# Patient Record
Sex: Male | Born: 1980 | Race: Black or African American | Hispanic: No | Marital: Single | Smoking: Never smoker
Health system: Southern US, Community
[De-identification: ages and names within clinical notes are randomized; demographics above are authoritative.]

## PROBLEM LIST (undated history)

## (undated) DIAGNOSIS — E119 Type 2 diabetes mellitus without complications: Secondary | ICD-10-CM

## (undated) DIAGNOSIS — I10 Essential (primary) hypertension: Secondary | ICD-10-CM

## (undated) HISTORY — DX: Essential (primary) hypertension: I10

## (undated) HISTORY — DX: Type 2 diabetes mellitus without complications: E11.9

---

## 2020-03-31 ENCOUNTER — Emergency Department: Payer: Self-pay

## 2020-03-31 ENCOUNTER — Other Ambulatory Visit: Payer: Self-pay

## 2020-03-31 ENCOUNTER — Emergency Department
Admission: EM | Admit: 2020-03-31 | Discharge: 2020-03-31 | Disposition: A | Discharge status: Home / Self Care | Payer: Self-pay | Attending: Emergency Medicine | Admitting: Emergency Medicine

## 2020-03-31 DIAGNOSIS — I10 Essential (primary) hypertension: Secondary | ICD-10-CM | POA: Insufficient documentation

## 2020-03-31 DIAGNOSIS — R112 Nausea with vomiting, unspecified: Secondary | ICD-10-CM | POA: Insufficient documentation

## 2020-03-31 DIAGNOSIS — R5381 Other malaise: Secondary | ICD-10-CM | POA: Insufficient documentation

## 2020-03-31 DIAGNOSIS — R1031 Right lower quadrant pain: Secondary | ICD-10-CM | POA: Insufficient documentation

## 2020-03-31 DIAGNOSIS — E119 Type 2 diabetes mellitus without complications: Secondary | ICD-10-CM | POA: Insufficient documentation

## 2020-03-31 LAB — GLUCOSE, CAPILLARY
Glucose-Capillary: 323 mg/dL — ABNORMAL HIGH (ref 70–99)
Glucose-Capillary: 287 mg/dL — ABNORMAL HIGH (ref 70–99)

## 2020-03-31 LAB — URINALYSIS, COMPLETE (UACMP) WITH MICROSCOPIC
Bacteria, UA: NONE SEEN
Bilirubin Urine: NEGATIVE
Glucose, UA: 50 mg/dL — AB
Hgb urine dipstick: NEGATIVE
Ketones, ur: 20 mg/dL — AB
Leukocytes,Ua: NEGATIVE
Nitrite: NEGATIVE
Protein, ur: NEGATIVE mg/dL
Specific Gravity, Urine: >1.046 — ABNORMAL HIGH (ref 1.005–1.030)
Squamous Epithelial / HPF: NONE SEEN (ref 0–5)
pH: 6 (ref 5.0–8.0)

## 2020-03-31 LAB — RAPID HIV SCREEN (HIV 1/2 AB+AG)
HIV 1/2 Antibodies: NONREACTIVE
HIV-1 P24 Antigen - HIV24: NONREACTIVE

## 2020-03-31 LAB — COMPREHENSIVE METABOLIC PANEL
ALT: 22 U/L (ref 0–44)
AST: 14 U/L — ABNORMAL LOW (ref 15–41)
Albumin: 4.5 g/dL (ref 3.5–5.0)
Alkaline Phosphatase: 62 U/L (ref 38–126)
Anion gap: 13 (ref 5–15)
BUN: 10 mg/dL (ref 6–20)
CO2: 22 mmol/L (ref 22–32)
Calcium: 9.4 mg/dL (ref 8.9–10.3)
Chloride: 98 mmol/L (ref 98–111)
Creatinine, Ser: 1.03 mg/dL (ref 0.61–1.24)
GFR calc Af Amer: >60 mL/min (ref >60)
GFR calc non Af Amer: >60 mL/min (ref >60)
Glucose, Bld: 306 mg/dL — ABNORMAL HIGH (ref 70–99)
Potassium: 3.1 mmol/L — ABNORMAL LOW (ref 3.5–5.1)
Sodium: 133 mmol/L — ABNORMAL LOW (ref 135–145)
Total Bilirubin: 1.5 mg/dL — ABNORMAL HIGH (ref 0.3–1.2)
Total Protein: 8.2 g/dL — ABNORMAL HIGH (ref 6.5–8.1)

## 2020-03-31 LAB — LIPASE, BLOOD: Lipase: 39 U/L (ref 11–51)

## 2020-03-31 LAB — CBC
HCT: 47.5 % (ref 39.0–52.0)
Hemoglobin: 16.2 g/dL (ref 13.0–17.0)
MCH: 22.6 pg — ABNORMAL LOW (ref 26.0–34.0)
MCHC: 34.1 g/dL (ref 30.0–36.0)
MCV: 66.2 fL — ABNORMAL LOW (ref 80.0–100.0)
Platelets: 337 10*3/uL (ref 150–400)
RBC: 7.18 MIL/uL — ABNORMAL HIGH (ref 4.22–5.81)
RDW: 16.8 % — ABNORMAL HIGH (ref 11.5–15.5)
WBC: 9.2 10*3/uL (ref 4.0–10.5)
nRBC: 0 % (ref 0.0–0.2)

## 2020-03-31 IMAGING — CT CT ABD-PELV W/ CM
2 of 5 series · 16 of 46 positions shown, 18 images · IV contrast (APPLIED)
Comparison: None.

CLINICAL DATA: Abdominal pain and vomiting.

EXAM:
CT ABDOMEN AND PELVIS WITH CONTRAST
TECHNIQUE: Multidetector CT imaging of the abdomen and pelvis was performed
using the standard protocol following bolus administration of
intravenous contrast.
CONTRAST:  125mL OMNIPAQUE IOHEXOL 300 MG/ML  SOLN

[Series 2: routine abd/pel with · axial · 0.98mm/px · z∈[-511,+14]mm · 13 of 119 slices shown, 15 images]
[im 7/119  soft-tissue]
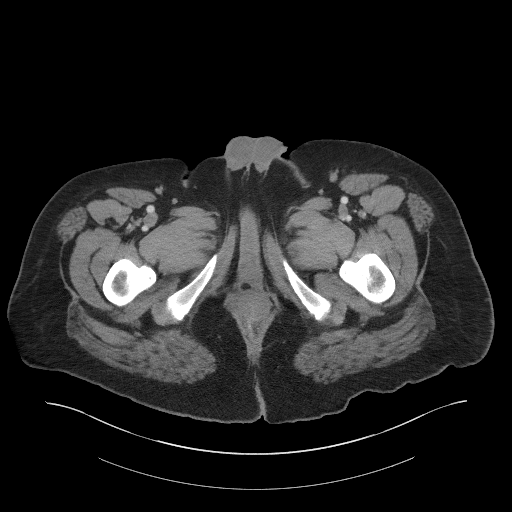
[im 7/119  bone]
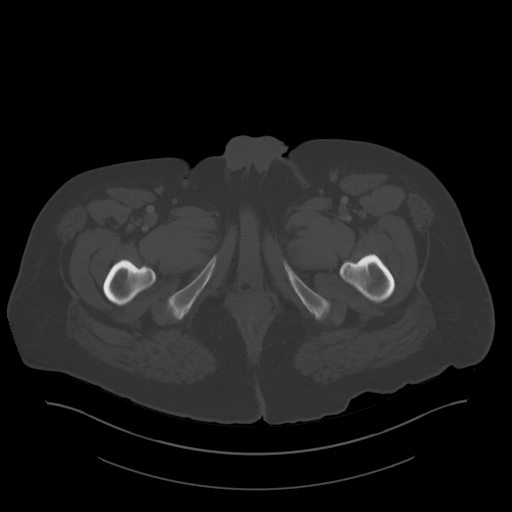
[im 19/119  soft-tissue]
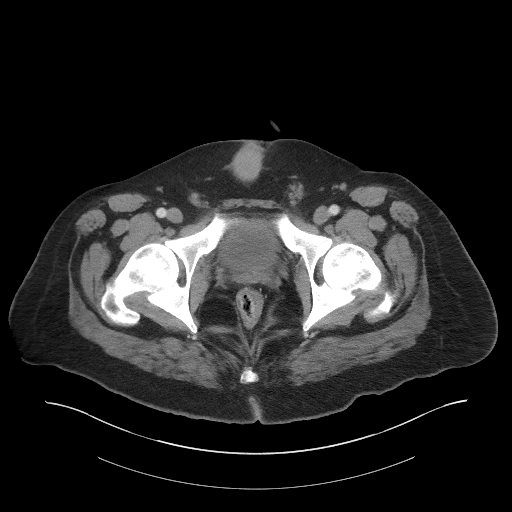
[im 25/119  soft-tissue]
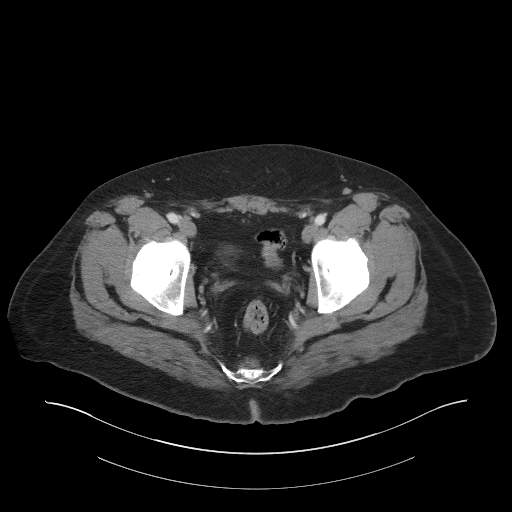
[im 32/119  soft-tissue]
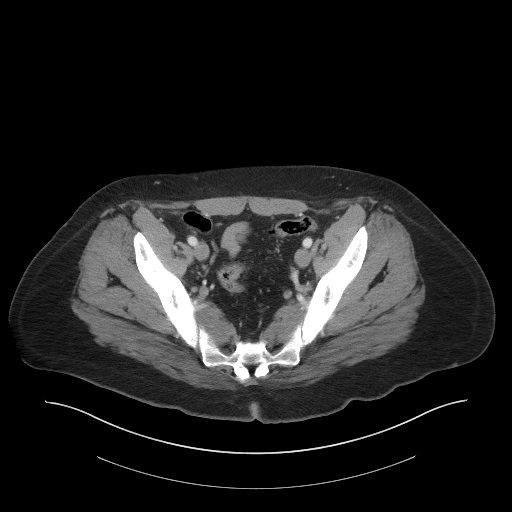
[im 44/119  soft-tissue]
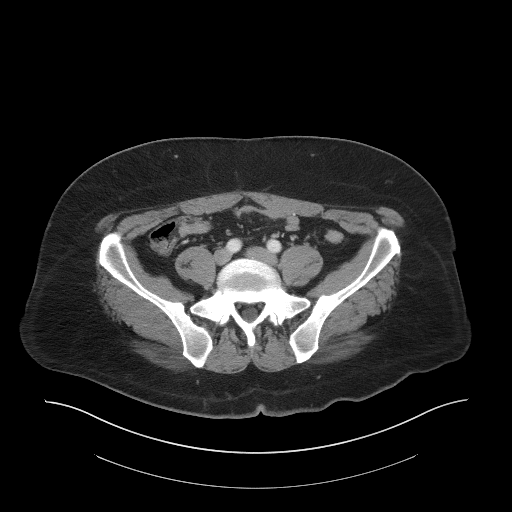
[im 50/119  soft-tissue]
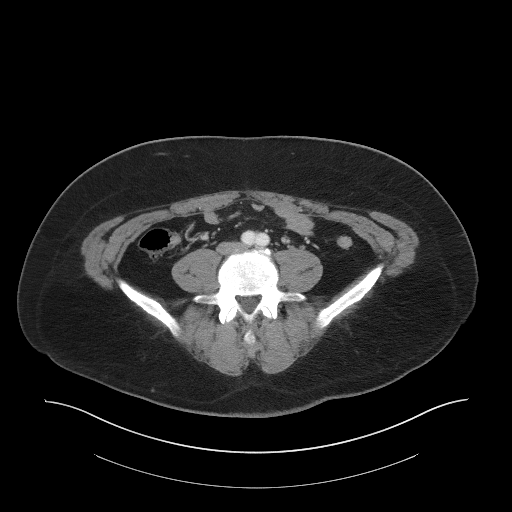
[im 63/119  soft-tissue]
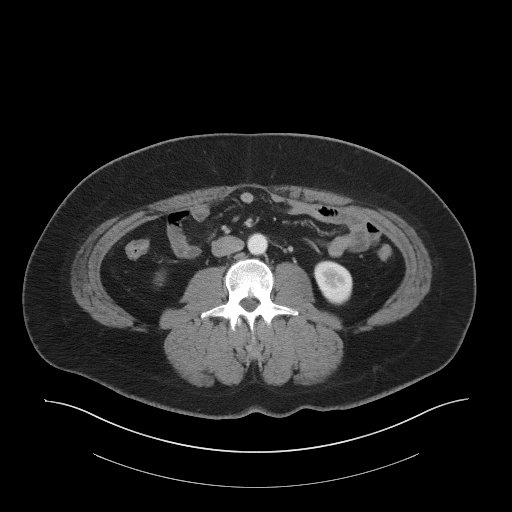
[im 69/119  soft-tissue]
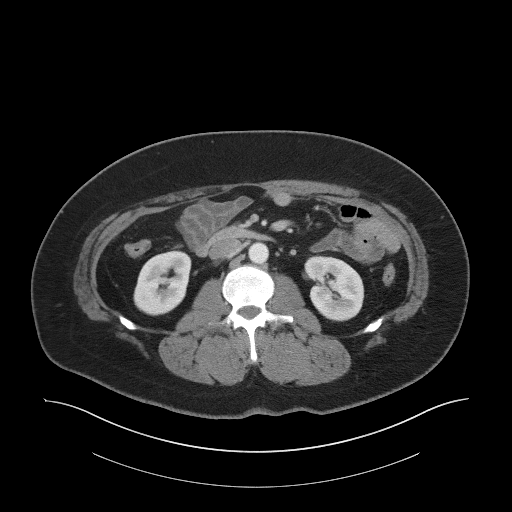
[im 75/119  soft-tissue]
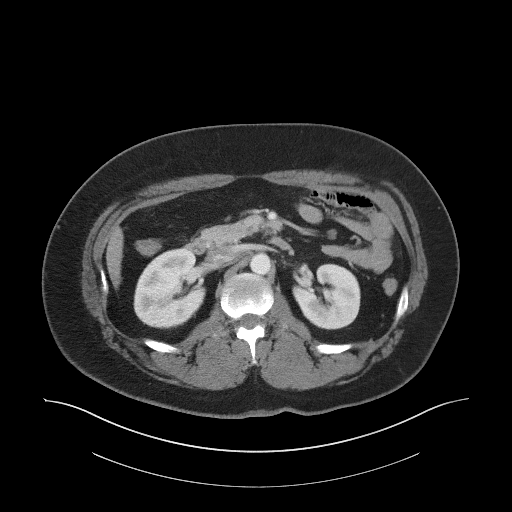
[im 75/119  bone]
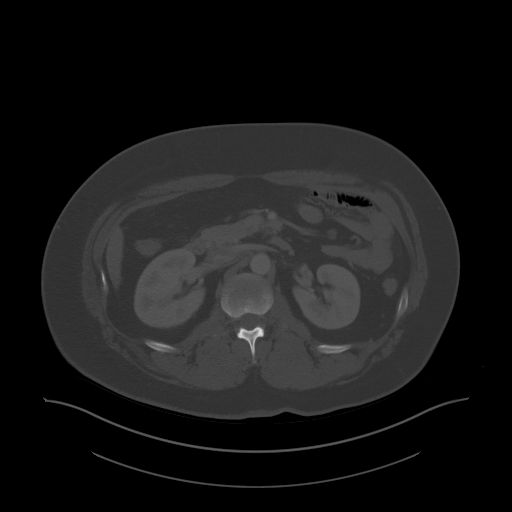
[im 87/119  soft-tissue]
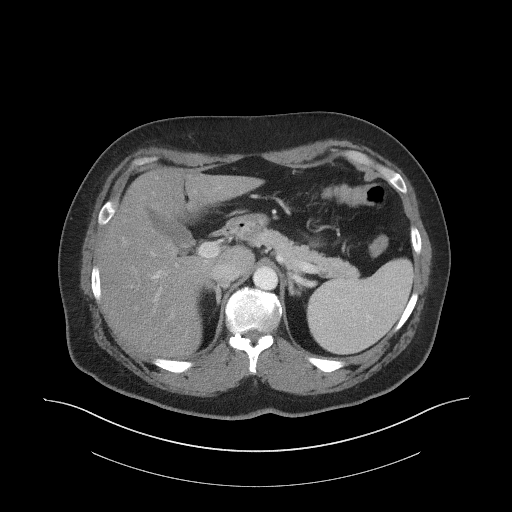
[im 94/119  soft-tissue]
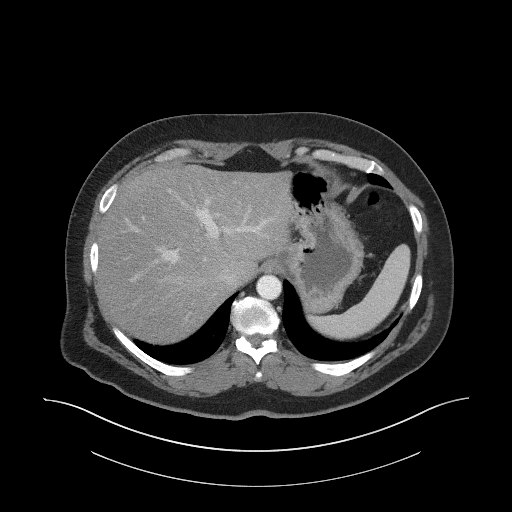
[im 100/119  soft-tissue]
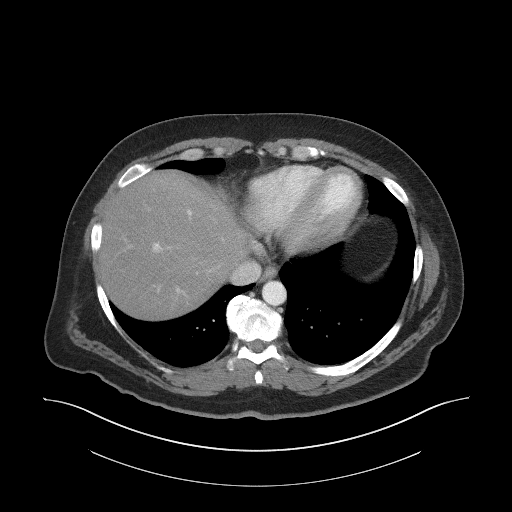
[im 112/119  soft-tissue]
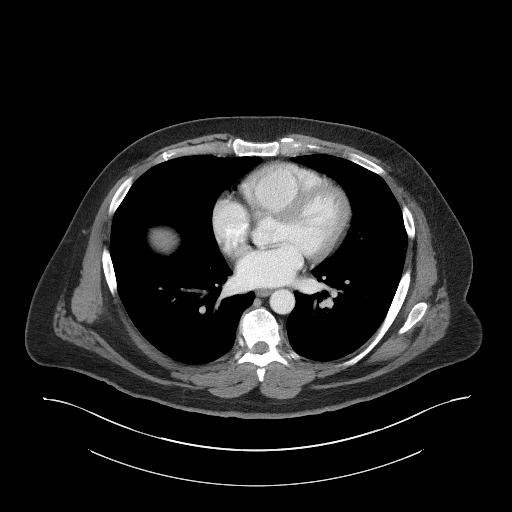

[Series 5: coronal st · coronal · 0.94mm/px · 3 of 94 slices shown]
[im 32/94  soft-tissue]
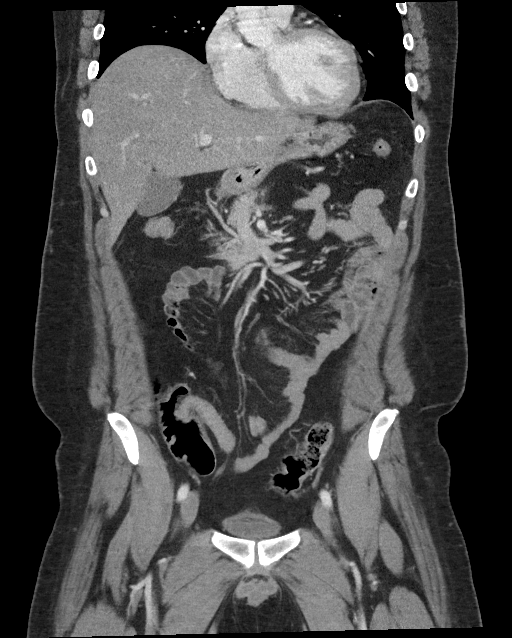
[im 42/94  soft-tissue]
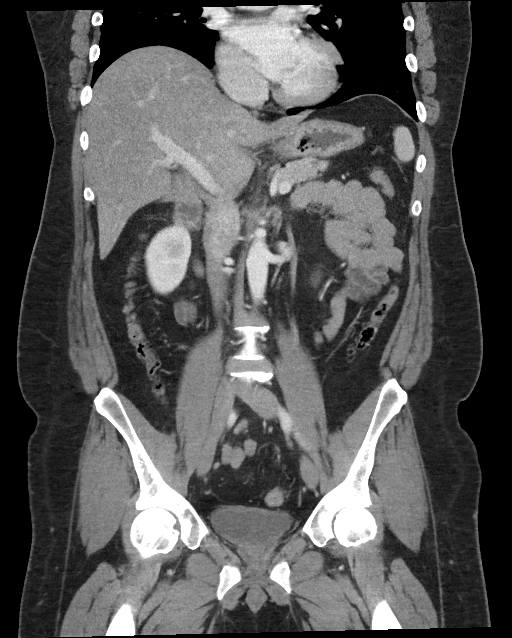
[im 52/94  soft-tissue]
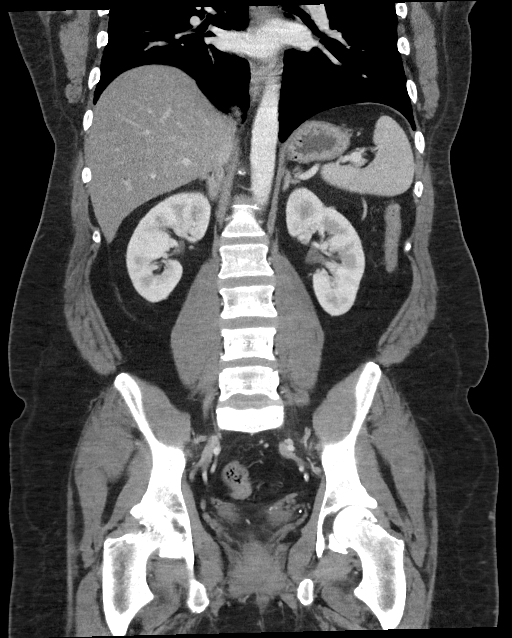

[16 of 46 positions shown; findings below may reference images not displayed]

FINDINGS: Lower chest: The lung bases are clear of acute process. No pleural
effusion or pulmonary lesions. The heart is normal in size. No
pericardial effusion. The distal esophagus and aorta are
unremarkable.

Hepatobiliary: Diffuse fatty infiltration of the liver but no focal
hepatic lesions or intrahepatic biliary dilatation. The gallbladder
is unremarkable. No common bile duct dilatation.

Pancreas: No mass, inflammation or ductal dilatation.

Spleen: Normal size.  No focal lesions.

Adrenals/Urinary Tract: The adrenal glands and kidneys are
unremarkable. No renal lesions or hydronephrosis. No obstructing
ureteral calculi or bladder calculi.

Stomach/Bowel: The stomach, duodenum, small bowel and colon are
grossly normal without oral contrast. No inflammatory changes, mass
lesions or obstructive findings. The appendix is normal.

Vascular/Lymphatic: The aorta is normal in caliber. No dissection.
The branch vessels are patent. The major venous structures are
patent. No mesenteric or retroperitoneal mass or adenopathy. Small
scattered lymph nodes are noted.

Reproductive: The prostate gland and seminal vesicles are
unremarkable.

Other: No pelvic mass or adenopathy. No free pelvic fluid
collections. No inguinal mass or adenopathy. No abdominal wall
hernia or subcutaneous lesions.

Musculoskeletal: No significant bony findings.
IMPRESSION: 1. No acute abdominal/pelvic findings, mass lesions or adenopathy.
2. Diffuse fatty infiltration of the liver.

## 2020-03-31 MED ORDER — IOHEXOL 300 MG/ML  SOLN
125.0000 mL | Freq: Once | INTRAMUSCULAR | Status: AC | PRN
  Administered 2020-03-31: 125 mL via INTRAVENOUS
  Filled 2020-03-31: qty 125

## 2020-03-31 MED ORDER — DICYCLOMINE HCL 10 MG PO CAPS: 10.0000 mg | ORAL_CAPSULE | Freq: Three times a day (TID) | ORAL | 0 refills | Status: AC

## 2020-03-31 MED ORDER — SODIUM CHLORIDE 0.9 % IV BOLUS
1000.0000 mL | Freq: Once | INTRAVENOUS | Status: AC
  Administered 2020-03-31: 1000 mL via INTRAVENOUS

## 2020-03-31 MED ORDER — PROMETHAZINE HCL 12.5 MG RE SUPP: 12.5000 mg | Freq: Three times a day (TID) | RECTAL | 0 refills | Status: AC | PRN

## 2020-03-31 MED ORDER — ONDANSETRON 4 MG PO TBDP
4.0000 mg | ORAL_TABLET | Freq: Once | ORAL | Status: AC | PRN
  Administered 2020-03-31: 4 mg via ORAL
  Filled 2020-03-31: qty 1

## 2020-03-31 MED ORDER — DICYCLOMINE HCL 10 MG/ML IM SOLN
20.0000 mg | Freq: Once | INTRAMUSCULAR | Status: AC
  Administered 2020-03-31: 20 mg via INTRAMUSCULAR
  Filled 2020-03-31: qty 2

## 2020-03-31 MED ORDER — PROMETHAZINE HCL 25 MG/ML IJ SOLN
12.5000 mg | Freq: Once | INTRAMUSCULAR | Status: AC
  Administered 2020-03-31: 12.5 mg via INTRAVENOUS
  Filled 2020-03-31: qty 1

## 2020-03-31 NOTE — ED Triage Notes (Signed)
Pt comes via POV from home with c/o vomiting and abdominal pain. Pt states this started Sunday and has gotten worse over the next few days. Pt states he felt little better on Monday and then came back.  Pt states he hasn't been able to keep anything down. Pt states his sugar may be high too. Pt states he has not been able to find any of his stuff to check his sugar.

## 2020-03-31 NOTE — ED Notes (Addendum)
Pt c/o diffuse abdominal cramps and on and off N/V x5 days. Pt states he cannot keep food or drink down. Pt denies diarrhea. Pt reports some relief of nausea w/ zofran gvn in triage.

## 2020-03-31 NOTE — ED Notes (Signed)
Per provider pt can be discharged after urine collected/ran

## 2020-03-31 NOTE — ED Provider Notes (Signed)
Emergency Department Provider Note  ____________________________________________  Time seen: Approximately 3:03 PM  I have reviewed the triage vital signs and the nursing notes.   HISTORY  Chief Complaint Abdominal Pain and Emesis   Historian Patient     HPI Spencer King is a 39 y.o. male with a history of diabetes and hypertension, presents to the emergency department with nausea, vomiting and abdominal pain for the past 5 days.  Patient characterizes his pain as 8 out of 10 in intensity, sharp, episodic and nonradiating.  Patient states that he primarily experiences pain along the periumbilical abdomen and right lower quadrant but states that pain has radiated some to the left lower quadrant as well.  He states that he has had chills and sweats at home.  He has had no associated rhinorrhea, nasal congestion or nonproductive cough.  He denies diarrhea.  Denies a history of GI issues or GI surgeries.  States that he has never experienced similar symptoms in the past.    Past Medical History:  Diagnosis Date  . Diabetes mellitus without complication (HCC)   . Hypertension      Immunizations up to date:  Yes.     Past Medical History:  Diagnosis Date  . Diabetes mellitus without complication (HCC)   . Hypertension     There are no problems to display for this patient.   History reviewed. No pertinent surgical history.  Prior to Admission medications   Medication Sig Start Date End Date Taking? Authorizing Provider  dicyclomine (BENTYL) 10 MG capsule Take 1 capsule (10 mg total) by mouth 3 (three) times daily before meals for 10 days. 03/31/20 04/10/20  Orvil Feil, PA-C  promethazine (PHENERGAN) 12.5 MG suppository Place 1 suppository (12.5 mg total) rectally every 8 (eight) hours as needed for up to 3 days for nausea or vomiting. 03/31/20 04/03/20  Orvil Feil, PA-C    Allergies Patient has no allergy information on record.  No family history on  file.  Social History Social History   Tobacco Use  . Smoking status: Never Smoker  . Smokeless tobacco: Never Used  Substance Use Topics  . Alcohol use: Never  . Drug use: Yes    Types: Marijuana     Review of Systems  Constitutional: No fever/chills Eyes:  No discharge ENT: No upper respiratory complaints. Respiratory: no cough. No SOB/ use of accessory muscles to breath Gastrointestinal: Patient has periumbilical and RLQ abdominal pain.  Musculoskeletal: Negative for musculoskeletal pain. Skin: Negative for rash, abrasions, lacerations, ecchymosis.    ____________________________________________   PHYSICAL EXAM:  VITAL SIGNS: ED Triage Vitals [03/31/20 1235]  Enc Vitals Group     BP 110/88     Pulse Rate (!) 108     Resp 18     Temp 98 F (36.7 C)     Temp src      SpO2 100 %     Weight 290 lb (131.5 kg)     Height 6\' 3"  (1.905 m)     Head Circumference      Peak Flow      Pain Score 10     Pain Loc      Pain Edu?      Excl. in GC?      Constitutional: Alert and oriented. Well appearing and in no acute distress. Eyes: Conjunctivae are normal. PERRL. EOMI. Head: Atraumatic.  Cardiovascular: Normal rate, regular rhythm. Normal S1 and S2.  Good peripheral circulation. Respiratory: Normal respiratory effort without tachypnea  or retractions. Lungs CTAB. Good air entry to the bases with no decreased or absent breath sounds Gastrointestinal: Bowel sounds x 4 quadrants.  Patient has tenderness to palpation with guarding in the periumbilical abdomen and right lower quadrant. Musculoskeletal: Full range of motion to all extremities. No obvious deformities noted Neurologic:  Normal for age. No gross focal neurologic deficits are appreciated.  Skin:  Skin is warm, dry and intact. No rash noted. Psychiatric: Mood and affect are normal for age. Speech and behavior are normal.   ____________________________________________   LABS (all labs ordered are listed, but  only abnormal results are displayed)  Labs Reviewed  GLUCOSE, CAPILLARY - Abnormal; Notable for the following components:      Result Value   Glucose-Capillary 323 (*)    All other components within normal limits  COMPREHENSIVE METABOLIC PANEL - Abnormal; Notable for the following components:   Sodium 133 (*)    Potassium 3.1 (*)    Glucose, Bld 306 (*)    Total Protein 8.2 (*)    AST 14 (*)    Total Bilirubin 1.5 (*)    All other components within normal limits  CBC - Abnormal; Notable for the following components:   RBC 7.18 (*)    MCV 66.2 (*)    MCH 22.6 (*)    RDW 16.8 (*)    All other components within normal limits  URINALYSIS, COMPLETE (UACMP) WITH MICROSCOPIC - Abnormal; Notable for the following components:   Color, Urine YELLOW (*)    APPearance CLEAR (*)    Specific Gravity, Urine >1.046 (*)    Glucose, UA 50 (*)    Ketones, ur 20 (*)    All other components within normal limits  GLUCOSE, CAPILLARY - Abnormal; Notable for the following components:   Glucose-Capillary 287 (*)    All other components within normal limits  LIPASE, BLOOD  RAPID HIV SCREEN (HIV 1/2 AB+AG)  CBG MONITORING, ED   ____________________________________________  EKG   ____________________________________________  RADIOLOGY Geraldo Pitter, personally viewed and evaluated these images (plain radiographs) as part of my medical decision making, as well as reviewing the written report by the radiologist.    CT ABDOMEN PELVIS W CONTRAST  Result Date: 03/31/2020 CLINICAL DATA:  Abdominal pain and vomiting. EXAM: CT ABDOMEN AND PELVIS WITH CONTRAST TECHNIQUE: Multidetector CT imaging of the abdomen and pelvis was performed using the standard protocol following bolus administration of intravenous contrast. CONTRAST:  OMNIPAQUE IOHEXOL 300 MG/ML  SOLN COMPARISON:  None. FINDINGS: Lower chest: The lung bases are clear of acute process. No pleural effusion or pulmonary lesions. The heart  is normal in size. No pericardial effusion. The distal esophagus and aorta are unremarkable. Hepatobiliary: Diffuse fatty infiltration of the liver but no focal hepatic lesions or intrahepatic biliary dilatation. The gallbladder is unremarkable. No common bile duct dilatation. Pancreas: No mass, inflammation or ductal dilatation. Spleen: Normal size.  No focal lesions. Adrenals/Urinary Tract: The adrenal glands and kidneys are unremarkable. No renal lesions or hydronephrosis. No obstructing ureteral calculi or bladder calculi. Stomach/Bowel: The stomach, duodenum, small bowel and colon are grossly normal without oral contrast. No inflammatory changes, mass lesions or obstructive findings. The appendix is normal. Vascular/Lymphatic: The aorta is normal in caliber. No dissection. The branch vessels are patent. The major venous structures are patent. No mesenteric or retroperitoneal mass or adenopathy. Small scattered lymph nodes are noted. Reproductive: The prostate gland and seminal vesicles are unremarkable. Other: No pelvic mass or adenopathy. No  free pelvic fluid collections. No inguinal mass or adenopathy. No abdominal wall hernia or subcutaneous lesions. Musculoskeletal: No significant bony findings. IMPRESSION: 1. No acute abdominal/pelvic findings, mass lesions or adenopathy. 2. Diffuse fatty infiltration of the liver. Electronically Signed   By: Rudie Meyer M.D.   On: 03/31/2020 16:11    ____________________________________________    PROCEDURES  Procedure(s) performed:     Procedures     Medications  ondansetron (ZOFRAN-ODT) disintegrating tablet 4 mg (4 mg Oral Given 03/31/20 1239)  sodium chloride 0.9 % bolus 1,000 mL (0 mLs Intravenous Stopped 03/31/20 1714)  promethazine (PHENERGAN) injection 12.5 mg (12.5 mg Intravenous Given 03/31/20 1528)  iohexol (OMNIPAQUE) 300 MG/ML solution 125 mL (125 mLs Intravenous Contrast Given 03/31/20 1536)  dicyclomine (BENTYL) injection 20 mg (20 mg  Intramuscular Given 03/31/20 1624)  sodium chloride 0.9 % bolus 1,000 mL (1,000 mLs Intravenous New Bag/Given 03/31/20 1723)     ____________________________________________   INITIAL IMPRESSION / ASSESSMENT AND PLAN / ED COURSE  Pertinent labs & imaging results that were available during my care of the patient were reviewed by me and considered in my medical decision making (see chart for details).  Clinical Course as of Mar 31 1757  Fri Mar 31, 2020  1726 WBC: 9.2 [JW]    Clinical Course User Index [JW] Orvil Feil, PA-C     Assessment and Plan:  Abdominal Pain:  39 year old male presents to the emergency department with abdominal pain, vomiting and generalized malaise for the past 5 days.  Patient was tachycardic at triage but vital signs were otherwise reassuring.  On physical exam, patient was diaphoretic and nauseated.  He had periumbilical and right lower quadrant abdominal tenderness with guarding.  Differential diagnosis includes appendicitis, gastroenteritis, gastritis, mesenteric lymphadenitis, diverticulitis...  CBC was reassuring without leukocytosis.  CMP indicated mildly elevated T bili and AST.  Lipase was within reference range.  Urinalysis revealed no evidence of urinary tract infection.  Patient's glucose level trended down after supplemental fluids.  CT abdomen and pelvis revealed no acute abnormality.  Patient reported that his pain and nausea improved after Phenergan and Bentyl were administered.  Patient received 1 and half liters of normal saline in the emergency department.  He was discharged with Bentyl and Phenergan.  Return precautions were given to return with new or worsening symptoms.  All patient questions were answered.  ____________________________________________  FINAL CLINICAL IMPRESSION(S) / ED DIAGNOSES  Final diagnoses:  Non-intractable vomiting with nausea, unspecified vomiting type      NEW MEDICATIONS STARTED DURING THIS  VISIT:  ED Discharge Orders         Ordered    promethazine (PHENERGAN) 12.5 MG suppository  Every 8 hours PRN        03/31/20 1754    dicyclomine (BENTYL) 10 MG capsule  3 times daily before meals        03/31/20 1755              This chart was dictated using voice recognition software/Dragon. Despite best efforts to proofread, errors can occur which can change the meaning. Any change was purely unintentional.     Orvil Feil, PA-C 03/31/20 1800    Shaune Pollack, MD 04/07/20 [phone]

## 2021-02-17 ENCOUNTER — Encounter: Payer: Self-pay | Admitting: Nurse Practitioner

## 2021-02-17 DIAGNOSIS — E1159 Type 2 diabetes mellitus with other circulatory complications: Secondary | ICD-10-CM | POA: Insufficient documentation

## 2021-02-17 DIAGNOSIS — E1169 Type 2 diabetes mellitus with other specified complication: Secondary | ICD-10-CM | POA: Insufficient documentation

## 2021-02-17 DIAGNOSIS — I152 Hypertension secondary to endocrine disorders: Secondary | ICD-10-CM | POA: Insufficient documentation

## 2021-02-21 ENCOUNTER — Ambulatory Visit: Payer: Self-pay | Admitting: Nurse Practitioner

## 2021-05-07 ENCOUNTER — Ambulatory Visit: Payer: Self-pay | Admitting: Emergency Medicine
